# Patient Record
Sex: Male | Born: 1995 | Race: White | Marital: Single | Smoking: Current every day smoker
Health system: Southern US, Community
[De-identification: ages and names within clinical notes are randomized; demographics above are authoritative.]

---

## 2019-08-03 ENCOUNTER — Encounter: Payer: Self-pay | Admitting: Emergency Medicine

## 2019-08-03 ENCOUNTER — Emergency Department
Admission: EM | Admit: 2019-08-03 | Discharge: 2019-08-03 | Disposition: A | Discharge status: Home / Self Care | Attending: Emergency Medicine | Admitting: Emergency Medicine

## 2019-08-03 ENCOUNTER — Other Ambulatory Visit: Payer: Self-pay

## 2019-08-03 ENCOUNTER — Emergency Department

## 2019-08-03 DIAGNOSIS — S060X1A Concussion with loss of consciousness of 30 minutes or less, initial encounter: Secondary | ICD-10-CM | POA: Diagnosis not present

## 2019-08-03 DIAGNOSIS — Y939 Activity, unspecified: Secondary | ICD-10-CM | POA: Diagnosis not present

## 2019-08-03 DIAGNOSIS — F17200 Nicotine dependence, unspecified, uncomplicated: Secondary | ICD-10-CM | POA: Diagnosis not present

## 2019-08-03 DIAGNOSIS — Y929 Unspecified place or not applicable: Secondary | ICD-10-CM | POA: Diagnosis not present

## 2019-08-03 DIAGNOSIS — R519 Headache, unspecified: Secondary | ICD-10-CM | POA: Diagnosis present

## 2019-08-03 DIAGNOSIS — Y999 Unspecified external cause status: Secondary | ICD-10-CM | POA: Diagnosis not present

## 2019-08-03 MED ORDER — ONDANSETRON 4 MG PO TBDP
4.0000 mg | ORAL_TABLET | Freq: Three times a day (TID) | ORAL | 0 refills | Status: AC | PRN
Start: 2019-08-03 — End: ?
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
## 2019-08-03 NOTE — Discharge Instructions (Signed)
Follow-up with your regular doctor at the [employer] [facility].  You should rest for 2 to 3 days.  If you are continue to have headache she should be reevaluated at that time.  Treatment for concussion include she is staying in a quiet environment, avoiding bright lights, avoiding bluelight screen such as computers, iPhone's etc.  Patient Tylenol or ibuprofen for pain as needed.  Return to emergency department if worsening
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
